# Patient Record
Sex: Male | Born: 1970 | Race: White | Hispanic: No | Marital: Married | Smoking: Never smoker
Health system: Southern US, Community
[De-identification: ages and names within clinical notes are randomized; demographics above are authoritative.]

## PROBLEM LIST (undated history)

## (undated) DIAGNOSIS — E039 Hypothyroidism, unspecified: Secondary | ICD-10-CM

## (undated) DIAGNOSIS — E079 Disorder of thyroid, unspecified: Secondary | ICD-10-CM

## (undated) DIAGNOSIS — K219 Gastro-esophageal reflux disease without esophagitis: Secondary | ICD-10-CM

## (undated) HISTORY — PX: HERNIA REPAIR: SHX51

## (undated) HISTORY — PX: COLONOSCOPY: SHX174

---

## 2020-03-17 ENCOUNTER — Other Ambulatory Visit: Payer: Self-pay

## 2020-03-17 ENCOUNTER — Ambulatory Visit
Admission: EM | Admit: 2020-03-17 | Discharge: 2020-03-17 | Disposition: A | Discharge status: Home / Self Care | Payer: Commercial Managed Care - PPO

## 2020-03-17 DIAGNOSIS — J069 Acute upper respiratory infection, unspecified: Secondary | ICD-10-CM

## 2020-03-17 DIAGNOSIS — Z20822 Contact with and (suspected) exposure to covid-19: Secondary | ICD-10-CM

## 2020-03-17 HISTORY — DX: Disorder of thyroid, unspecified: E07.9

## 2020-03-17 MED ORDER — PREDNISONE 10 MG PO TABS
20.0000 mg | ORAL_TABLET | Freq: Every day | ORAL | 0 refills | Status: DC
Start: 2020-03-17 — End: 2021-11-03

## 2020-03-17 MED ORDER — CETIRIZINE HCL 10 MG PO TABS: 10.0000 mg | ORAL_TABLET | Freq: Every day | ORAL | 0 refills | Status: DC

## 2020-03-17 MED ORDER — BENZONATATE 100 MG PO CAPS
100.0000 mg | ORAL_CAPSULE | Freq: Three times a day (TID) | ORAL | 0 refills | Status: DC
Start: 2020-03-17 — End: 2021-11-03

## 2020-03-17 MED ORDER — FLUTICASONE PROPIONATE 50 MCG/ACT NA SUSP
1.0000 | Freq: Every day | NASAL | 0 refills | Status: DC
Start: 2020-03-17 — End: 2021-11-03

## 2020-03-17 NOTE — ED Provider Notes (Signed)
[facility]   [id number] 03/17/20 Arrival Time: 0817   CC: COVID Exposure  SUBJECTIVE: History from: patient.  Douglas Mcintyre is a 49 y.o. male who presents to the urgent care for complaint of nasal congestion, cough, loss of taste and smell for the past few days.  Wife tested positive for COVID-19.Marland Kitchen  Denies sick exposure to  flu or strep.  Denies recent travel.  Has tried OTC medication without relief.  Denies alleviating factors.  Denies previous symptoms in the past.   Denies fever, chills, fatigue, sinus pain, rhinorrhea, sore throat, SOB, wheezing, chest pain, nausea, changes in bowel or bladder habits.     ROS: As per HPI.  All other pertinent ROS negative.      Past Medical History:  Diagnosis Date  . Thyroid disease    Past Surgical History:  Procedure Laterality Date  . HERNIA REPAIR     No Known Allergies No current facility-administered medications on file prior to encounter.   Current Outpatient Medications on File Prior to Encounter  Medication Sig Dispense Refill  . levothyroxine (SYNTHROID) 88 MCG tablet Take 88 mcg by mouth daily before breakfast.     Social History   Socioeconomic History  . Marital status: Married    Spouse name: Not on file  . Number of children: Not on file  . Years of education: Not on file  . Highest education level: Not on file  Occupational History  . Not on file  Tobacco Use  . Smoking status: Never Smoker  . Smokeless tobacco: Never Used  Substance and Sexual Activity  . Alcohol use: Not Currently  . Drug use: Never  . Sexual activity: Not on file  Other Topics Concern  . Not on file  Social History Narrative  . Not on file   Social Determinants of Health   Financial Resource Strain:   . Difficulty of Paying Living Expenses:   Food Insecurity:   . Worried About Programme researcher, broadcasting/film/video in the Last Year:   . Barista in the Last Year:   Transportation Needs:   . Freight forwarder (Medical):   Marland Kitchen  Lack of Transportation (Non-Medical):   Physical Activity:   . Days of Exercise per Week:   . Minutes of Exercise per Session:   Stress:   . Feeling of Stress :   Social Connections:   . Frequency of Communication with Friends and Family:   . Frequency of Social Gatherings with Friends and Family:   . Attends Religious Services:   . Active Member of Clubs or Organizations:   . Attends Banker Meetings:   Marland Kitchen Marital Status:   Intimate Partner Violence:   . Fear of Current or Ex-Partner:   . Emotionally Abused:   Marland Kitchen Physically Abused:   . Sexually Abused:    History reviewed. No pertinent family history.  OBJECTIVE:  Vitals:   03/17/20 0826  BP: 127/84  Pulse: 82  Resp: 20  Temp: 98.4 F (36.9 C)  SpO2: 95%     General appearance: alert; appears fatigued, but nontoxic; speaking in full sentences and tolerating own secretions HEENT: NCAT; Ears: EACs clear, TMs pearly gray; Eyes: PERRL.  EOM grossly intact. Sinuses: nontender; Nose: nares patent without rhinorrhea, Throat: oropharynx clear, tonsils non erythematous or enlarged, uvula midline  Neck: supple without LAD Lungs: unlabored respirations, symmetrical air entry; cough: mild; no respiratory distress; CTAB Heart: regular rate and rhythm.  Radial pulses 2+ symmetrical bilaterally Skin:  warm and dry Psychological: alert and cooperative; normal mood and affect  LABS:  No results found for this or any previous visit (from the past 24 hour(s)).   ASSESSMENT & PLAN:  1. Close exposure to COVID-19 virus     Meds ordered this encounter  Medications  . cetirizine (ZYRTEC ALLERGY) 10 MG tablet    Sig: Take 1 tablet (10 mg total) by mouth daily.    Dispense:  30 tablet    Refill:  0  . fluticasone (FLONASE) 50 MCG/ACT nasal spray    Sig: Place 1 spray into both nostrils daily for 14 days.    Dispense:  16 g    Refill:  0  . benzonatate (TESSALON) 100 MG capsule    Sig: Take 1 capsule (100 mg total) by  mouth every 8 (eight) hours.    Dispense:  30 capsule    Refill:  0  . predniSONE (DELTASONE) 10 MG tablet    Sig: Take 2 tablets (20 mg total) by mouth daily.    Dispense:  15 tablet    Refill:  0    Discharge Instructions.    COVID testing ordered.  It will take between 2-7 days for test results.  Someone will contact you regarding abnormal results.    In the meantime: You should remain isolated in your home for 10 days from symptom onset AND greater than 24 hours after symptoms resolution (absence of fever without the use of fever-reducing medication and improvement in respiratory symptoms), whichever is longer Get plenty of rest and push fluids Tessalon Perles prescribed for cough Low-dose prednisone prescribed Zyrtec for nasal congestion, runny nose, and/or sore throat Flonase for nasal congestion and runny nose Use medications daily for symptom relief Use OTC medications like ibuprofen or tylenol as needed fever or pain Call or go to the ED if you have any new or worsening symptoms such as fever, worsening cough, shortness of breath, chest tightness, chest pain, turning blue, changes in mental status, etc...   Reviewed expectations re: course of current medical issues. Questions answered. Outlined signs and symptoms indicating need for more acute intervention. Patient verbalized understanding. After Visit Summary given.      Note: This document was prepared using Dragon voice recognition software and may include unintentional dictation errors.    Durward Parcel, FNP 03/17/20 [phone]

## 2020-03-17 NOTE — Discharge Instructions (Addendum)
COVID testing ordered.  It will take between 2-7 days for test results.  Someone will contact you regarding abnormal results.    In the meantime: You should remain isolated in your home for 10 days from symptom onset AND greater than 24 hours after symptoms resolution (absence of fever without the use of fever-reducing medication and improvement in respiratory symptoms), whichever is longer Get plenty of rest and push fluids Tessalon Perles prescribed for cough Low-dose prednisone prescribed Zyrtec for nasal congestion, runny nose, and/or sore throat Flonase for nasal congestion and runny nose Use medications daily for symptom relief Use OTC medications like ibuprofen or tylenol as needed fever or pain Call or go to the ED if you have any new or worsening symptoms such as fever, worsening cough, shortness of breath, chest tightness, chest pain, turning blue, changes in mental status, etc..Marland Kitchen

## 2020-03-17 NOTE — ED Triage Notes (Signed)
Pt presents with c/o nasal congestion and loss of taste and smell , wife is positive

## 2020-03-18 LAB — SARS-COV-2, NAA 2 DAY TAT

## 2020-03-18 LAB — NOVEL CORONAVIRUS, NAA: SARS-CoV-2, NAA: DETECTED — AB

## 2021-10-30 ENCOUNTER — Other Ambulatory Visit: Payer: Self-pay

## 2021-10-30 ENCOUNTER — Encounter (HOSPITAL_COMMUNITY): Payer: Self-pay

## 2021-10-30 ENCOUNTER — Emergency Department (HOSPITAL_COMMUNITY)
Admission: EM | Admit: 2021-10-30 | Discharge: 2021-10-30 | Disposition: A | Discharge status: Home / Self Care | Payer: Commercial Managed Care - PPO | Attending: Emergency Medicine | Admitting: Emergency Medicine

## 2021-10-30 ENCOUNTER — Emergency Department (HOSPITAL_COMMUNITY): Payer: Commercial Managed Care - PPO

## 2021-10-30 DIAGNOSIS — D72829 Elevated white blood cell count, unspecified: Secondary | ICD-10-CM | POA: Insufficient documentation

## 2021-10-30 DIAGNOSIS — Z79899 Other long term (current) drug therapy: Secondary | ICD-10-CM | POA: Diagnosis not present

## 2021-10-30 DIAGNOSIS — R7309 Other abnormal glucose: Secondary | ICD-10-CM | POA: Insufficient documentation

## 2021-10-30 DIAGNOSIS — K420 Umbilical hernia with obstruction, without gangrene: Secondary | ICD-10-CM | POA: Insufficient documentation

## 2021-10-30 DIAGNOSIS — E039 Hypothyroidism, unspecified: Secondary | ICD-10-CM | POA: Insufficient documentation

## 2021-10-30 DIAGNOSIS — R1033 Periumbilical pain: Secondary | ICD-10-CM | POA: Diagnosis present

## 2021-10-30 DIAGNOSIS — R001 Bradycardia, unspecified: Secondary | ICD-10-CM | POA: Insufficient documentation

## 2021-10-30 LAB — COMPREHENSIVE METABOLIC PANEL
ALT: 15 U/L (ref 0–44)
AST: 19 U/L (ref 15–41)
Albumin: 4.4 g/dL (ref 3.5–5.0)
Alkaline Phosphatase: 46 U/L (ref 38–126)
Anion gap: 7 (ref 5–15)
BUN: 16 mg/dL (ref 6–20)
CO2: 26 mmol/L (ref 22–32)
Calcium: 9.3 mg/dL (ref 8.9–10.3)
Chloride: 103 mmol/L (ref 98–111)
Creatinine, Ser: 1.17 mg/dL (ref 0.61–1.24)
GFR, Estimated: >60 mL/min (ref >60)
Glucose, Bld: 164 mg/dL — ABNORMAL HIGH (ref 70–99)
Potassium: 3.5 mmol/L (ref 3.5–5.1)
Sodium: 136 mmol/L (ref 135–145)
Total Bilirubin: 0.6 mg/dL (ref 0.3–1.2)
Total Protein: 7.1 g/dL (ref 6.5–8.1)

## 2021-10-30 LAB — CBC WITH DIFFERENTIAL/PLATELET
Abs Immature Granulocytes: 0.04 10*3/uL (ref 0.00–0.07)
Basophils Absolute: 0.1 10*3/uL (ref 0.0–0.1)
Basophils Relative: 1 %
Eosinophils Absolute: 0.1 10*3/uL (ref 0.0–0.5)
Eosinophils Relative: 1 %
HCT: 39.3 % (ref 39.0–52.0)
Hemoglobin: 13.9 g/dL (ref 13.0–17.0)
Immature Granulocytes: 0 %
Lymphocytes Relative: 19 %
Lymphs Abs: 2 10*3/uL (ref 0.7–4.0)
MCH: 31.1 pg (ref 26.0–34.0)
MCHC: 35.4 g/dL (ref 30.0–36.0)
MCV: 87.9 fL (ref 80.0–100.0)
Monocytes Absolute: 0.5 10*3/uL (ref 0.1–1.0)
Monocytes Relative: 5 %
Neutro Abs: 7.9 10*3/uL — ABNORMAL HIGH (ref 1.7–7.7)
Neutrophils Relative %: 74 %
Platelets: 277 10*3/uL (ref 150–400)
RBC: 4.47 MIL/uL (ref 4.22–5.81)
RDW: 11.3 % — ABNORMAL LOW (ref 11.5–15.5)
WBC: 10.7 10*3/uL — ABNORMAL HIGH (ref 4.0–10.5)
nRBC: 0 % (ref 0.0–0.2)

## 2021-10-30 LAB — LIPASE, BLOOD: Lipase: 44 U/L (ref 11–51)

## 2021-10-30 MED ORDER — IOHEXOL 300 MG/ML  SOLN
100.0000 mL | Freq: Once | INTRAMUSCULAR | Status: AC | PRN
  Administered 2021-10-30: 100 mL via INTRAVENOUS

## 2021-10-30 MED ORDER — ONDANSETRON HCL 4 MG/2ML IJ SOLN
4.0000 mg | Freq: Once | INTRAMUSCULAR | Status: AC
  Administered 2021-10-30: 4 mg via INTRAVENOUS
  Filled 2021-10-30: qty 2

## 2021-10-30 MED ORDER — FENTANYL CITRATE PF 50 MCG/ML IJ SOSY
50.0000 ug | PREFILLED_SYRINGE | Freq: Once | INTRAMUSCULAR | Status: AC
  Administered 2021-10-30: 50 ug via INTRAVENOUS
  Filled 2021-10-30: qty 1

## 2021-10-30 MED ORDER — SODIUM CHLORIDE 0.9 % IV BOLUS
1000.0000 mL | Freq: Once | INTRAVENOUS | Status: AC
  Administered 2021-10-30: 1000 mL via INTRAVENOUS

## 2021-10-30 MED ORDER — HYDROMORPHONE HCL 1 MG/ML IJ SOLN
1.0000 mg | Freq: Once | INTRAMUSCULAR | Status: AC
  Administered 2021-10-30: 1 mg via INTRAVENOUS
  Filled 2021-10-30: qty 1

## 2021-10-30 NOTE — ED Provider Notes (Signed)
?[facility] EMERGENCY DEPARTMENT ?Provider Note ? ? ?CSN: [id number] ?Arrival date & time: 10/30/21  1037 ? ?  ? ?History ? ?Chief Complaint  ?Patient presents with  ? Abdominal Pain  ? ? ?Douglas Mcintyre is a 51 y.o. male. ? ?The history is provided by the patient.  ?Abdominal Pain ? ?Patient is a 51 year old male with history of hypothyroid presents today with umbilical abdominal pain.  Started roughly at 10 AM today while in church, its been constant.  Patient has a known umbilical hernia that is usually easily reducible, he was unable to reduce it while in church.  He is nauseated but has not vomited.  Patient feels lightheaded as if he is going to pass out but denies any chest pain or shortness of breath.  He thinks his last bowel movement was yesterday. ? ?Patient is status post ventral hernia repair, Dr. Orlan Leavens [facility] is his general surgeon.  He also had a colonoscopy performed recently which per him was unremarkable.   ? ?His last meal was at 9 AM this morning. ? ?Patient is not on any blood thinners. ? ?Home Medications ?Prior to Admission medications   ?Medication Sig Start Date End Date Taking? Authorizing Provider  ?benzonatate (TESSALON) 100 MG capsule Take 1 capsule (100 mg total) by mouth every 8 (eight) hours. 03/17/20   Durward Parcel, FNP  ?cetirizine (ZYRTEC ALLERGY) 10 MG tablet Take 1 tablet (10 mg total) by mouth daily. 03/17/20   Durward Parcel, FNP  ?fluticasone (FLONASE) 50 MCG/ACT nasal spray Place 1 spray into both nostrils daily for 14 days. 03/17/20 03/31/20  Durward Parcel, FNP  ?levothyroxine (SYNTHROID) 88 MCG tablet Take 88 mcg by mouth daily before breakfast.    [provider]  ?predniSONE (DELTASONE) 10 MG tablet Take 2 tablets (20 mg total) by mouth daily. 03/17/20   Durward Parcel, FNP  ?   ? ?Allergies    ?Patient has no known allergies.   ? ?Review of Systems   ?Review of Systems  ?Gastrointestinal:  Positive for abdominal pain.   ? ?Physical Exam ?Updated Vital Signs ?BP 130/74   Pulse 60   Temp 98.7 ?F (37.1 ?C) (Oral)   Resp 18   Ht 5\' 7"  (1.702 m)   Wt 83.9 kg   SpO2 100%   BMI 28.98 kg/m?  ?Physical Exam ?Vitals and nursing note reviewed. Exam conducted with a chaperone present.  ?Constitutional:   ?   Appearance: Normal appearance. He is ill-appearing.  ?HENT:  ?   Head: Normocephalic and atraumatic.  ?Eyes:  ?   General: No scleral icterus.    ?   Right eye: No discharge.     ?   Left eye: No discharge.  ?   Extraocular Movements: Extraocular movements intact.  ?   Pupils: Pupils are equal, round, and reactive to light.  ?Cardiovascular:  ?   Rate and Rhythm: Regular rhythm. Bradycardia present.  ?   Pulses: Normal pulses.  ?   Heart sounds: Normal heart sounds. No murmur heard. ?  No friction rub. No gallop.  ?Pulmonary:  ?   Effort: Pulmonary effort is normal. No respiratory distress.  ?   Breath sounds: Normal breath sounds.  ?Abdominal:  ?   General: Abdomen is flat. Bowel sounds are normal.  ?   Palpations: Abdomen is soft.  ?   Tenderness: There is abdominal tenderness in the periumbilical area. There is guarding.  ?   Hernia:  A hernia is present. Hernia is present in the umbilical area.  ?   Comments: Umbilical hernia is hard, not easily reducible.  Extremely tender.  ?Skin: ?   General: Skin is warm and dry.  ?   Coloration: Skin is pale. Skin is not jaundiced.  ?Neurological:  ?   Mental Status: He is alert. Mental status is at baseline.  ?   Coordination: Coordination normal.  ? ? ?ED Results / Procedures / Treatments   ?Labs ?(all labs ordered are listed, but only abnormal results are displayed) ?Labs Reviewed  ?CBC WITH DIFFERENTIAL/PLATELET - Abnormal; Notable for the following components:  ?    Result Value  ? WBC 10.7 (*)   ? RDW 11.3 (*)   ? Neutro Abs 7.9 (*)   ? All other components within normal limits  ?COMPREHENSIVE METABOLIC PANEL - Abnormal; Notable for the following components:  ? Glucose, Bld 164 (*)    ? All other components within normal limits  ?LIPASE, BLOOD  ? ? ?EKG ?EKG Interpretation ? ?Date/Time:  Sunday October 30 2021 11:18:55 EDT ?Ventricular Rate:  41 ?PR Interval:  124 ?QRS Duration: 103 ?QT Interval:  507 ?QTC Calculation: 419 ?R Axis:   80 ?Text Interpretation: Sinus bradycardia Abnormal ECG Confirmed by Gerhard Munch [phone]) on 10/30/2021 11:50:01 AM ? ?Radiology ?CT Abdomen Pelvis W Contrast ? ?Result Date: 10/30/2021 ?CLINICAL DATA:  51 year old male with abdominal pain at known umbilical hernia. EXAM: CT ABDOMEN AND PELVIS WITH CONTRAST TECHNIQUE: Multidetector CT imaging of the abdomen and pelvis was performed using the standard protocol following bolus administration of intravenous contrast. RADIATION DOSE REDUCTION: This exam was performed according to the departmental dose-optimization program which includes automated exposure control, adjustment of the mA and/or kV according to patient size and/or use of iterative reconstruction technique. CONTRAST:  OMNIPAQUE IOHEXOL 300 MG/ML  SOLN COMPARISON:  None. FINDINGS: Lower chest: No acute abnormality. Hepatobiliary: The liver and gallbladder are unremarkable except for small hepatic cysts. No biliary dilatation. Pancreas: Unremarkable Spleen: Unremarkable Adrenals/Urinary Tract: The kidneys, adrenal glands and bladder are unremarkable. Stomach/Bowel: A small to moderate umbilical hernia is identified containing a loop of small bowel, with mildly distended loops small bowel loops proximal to this hernia - compatible with early/developing small bowel obstruction. There is no evidence of pneumoperitoneum or abscess. Colonic diverticulosis is identified without evidence of acute diverticulitis. The appendix is normal. Vascular/Lymphatic: No significant vascular findings are present. No enlarged abdominal or pelvic lymph nodes. Reproductive: Prostate appears unremarkable. The seminal vesicles are prominent without adjacent inflammation or defined  mass. Other: No ascites, focal collection or pneumoperitoneum. Musculoskeletal: No acute or suspicious bony abnormalities are noted. IMPRESSION: 1. Small to moderate umbilical hernia containing a loop of small bowel and causing early/developing small bowel obstruction. No evidence of pneumoperitoneum or abscess. Electronically Signed   By: Harmon Pier M.D.   On: 10/30/2021 12:24   ? ?Procedures ?Procedures  ? ? ?Medications Ordered in ED ?Medications  ?fentaNYL (SUBLIMAZE) injection 50 mcg (50 mcg Intravenous Given 10/30/21 1110)  ?sodium chloride 0.9 % bolus 1,000 mL (0 mLs Intravenous Stopped 10/30/21 1224)  ?ondansetron [facility]) injection 4 mg (4 mg Intravenous Given 10/30/21 1110)  ?iohexol (OMNIPAQUE) 300 MG/ML solution 100 mL (100 mLs Intravenous Contrast Given 10/30/21 1144)  ?HYDROmorphone (DILAUDID) injection 1 mg (1 mg Intravenous Given 10/30/21 1240)  ?ondansetron (ZOFRAN) injection 4 mg (4 mg Intravenous Given 10/30/21 1240)  ? ? ?ED Course/ Medical Decision Making/ A&P ?Clinical Course as of 10/30/21  1555  ?Sun Oct 30, 2021  ?1238 Patient is now actively vomiting in the room. [HS]  ?  ?Clinical Course User Index ?[HS] Theron AristaSage, Deren Degrazia, PA-C  ? ?                        ?Medical Decision Making ?Amount and/or Complexity of Data Reviewed ?Labs: ordered. ?Radiology: ordered. ? ?Risk ?Prescription drug management. ? ? ?This patient presents to the ED for concern of pain alongside of umbilical hernia, this involves an extensive number of treatment options, and is a complaint that carries with it a high risk of complications and morbidity.  The differential diagnosis includes but not limited to: incarcerated hernia, strangulated hernia, small bowel obstruction, mesenteric ischemia. ? ? ?Additional history obtained:  ? ?Independent historian: wife ? ?Records reviewed, status post 1 ventral hernia repair but no previous abdominal surgeries. ?  ?Lab Tests: ? ?I ordered, viewed, and personally interpreted labs.  The pertinent  results include: Mild leukocytosis at 10.7.  No anemia.  No gross electrolyte derangement or AKI, slight elevation of glucose at 164.  Lipase within normal limits. ? ?  ?Imaging Studies ordered: ? ?I directly visualized th

## 2021-10-30 NOTE — Discharge Instructions (Signed)
You had and incarcerated hernia, this was reduced here in the emergency department.  Do not need to stay in the ED any longer, please return if this happens again.  As discussed with Dr. Lovell Sheehan please schedule appointment with general surgery for hernia repair in the future.  Return to the ED if it becomes stuck again. ?

## 2021-10-30 NOTE — Consult Note (Signed)
Reason for Consult: Incarcerated umbilical hernia ?Referring Physician: Dr. Jeraldine Loots ? ?Douglas Mcintyre is an 51 y.o. male.  ?HPI: Patient is a 51 year old white male with a known umbilical hernia for approximately the last 3 years who presented emergency room with worsening swelling and pain along with nausea at the umbilicus.  He did have a significant hard swelling in the umbilicus.  He states this started earlier today.  He was not able to reduce it and thus came to the emergency room.  He states he has had a similar episode late last year.  He does have some nausea but no vomiting. ? ?Past Medical History:  ?Diagnosis Date  ? Thyroid disease   ? ? ?Past Surgical History:  ?Procedure Laterality Date  ? HERNIA REPAIR    ? ? ?History reviewed. No pertinent family history. ? ?Social History:  reports that he has never smoked. He has never used smokeless tobacco. He reports that he does not currently use alcohol. He reports that he does not use drugs. ? ?Allergies: No Known Allergies ? ?Medications: I have reviewed the patient's current medications. ? ?Results for orders placed or performed during the hospital encounter of 10/30/21 (from the past 48 hour(s))  ?CBC with Differential     Status: Abnormal  ? Collection Time: 10/30/21 10:50 AM  ?Result Value Ref Range  ? WBC 10.7 (H) 4.0 - 10.5 K/uL  ? RBC 4.47 4.22 - 5.81 MIL/uL  ? Hemoglobin 13.9 13.0 - 17.0 g/dL  ? HCT 39.3 39.0 - 52.0 %  ? MCV 87.9 80.0 - 100.0 fL  ? MCH 31.1 26.0 - 34.0 pg  ? MCHC 35.4 30.0 - 36.0 g/dL  ? RDW 11.3 (L) 11.5 - 15.5 %  ? Platelets 277 150 - 400 K/uL  ? nRBC 0.0 0.0 - 0.2 %  ? Neutrophils Relative % 74 %  ? Neutro Abs 7.9 (H) 1.7 - 7.7 K/uL  ? Lymphocytes Relative 19 %  ? Lymphs Abs 2.0 0.7 - 4.0 K/uL  ? Monocytes Relative 5 %  ? Monocytes Absolute 0.5 0.1 - 1.0 K/uL  ? Eosinophils Relative 1 %  ? Eosinophils Absolute 0.1 0.0 - 0.5 K/uL  ? Basophils Relative 1 %  ? Basophils Absolute 0.1 0.0 - 0.1 K/uL  ? Immature Granulocytes 0 %  ?  Abs Immature Granulocytes 0.04 0.00 - 0.07 K/uL  ?  Comment: Performed at [facility], [address], [place], [place] [address]  ?Comprehensive metabolic panel     Status: Abnormal  ? Collection Time: 10/30/21 10:50 AM  ?Result Value Ref Range  ? Sodium 136 135 - 145 mmol/L  ? Potassium 3.5 3.5 - 5.1 mmol/L  ? Chloride 103 98 - 111 mmol/L  ? CO2 26 22 - 32 mmol/L  ? Glucose, Bld 164 (H) 70 - 99 mg/dL  ?  Comment: Glucose reference range applies only to samples taken after fasting for at least 8 hours.  ? BUN 16 6 - 20 mg/dL  ? Creatinine, Ser 1.17 0.61 - 1.24 mg/dL  ? Calcium 9.3 8.9 - 10.3 mg/dL  ? Total Protein 7.1 6.5 - 8.1 g/dL  ? Albumin 4.4 3.5 - 5.0 g/dL  ? AST 19 15 - 41 U/L  ? ALT 15 0 - 44 U/L  ? Alkaline Phosphatase 46 38 - 126 U/L  ? Total Bilirubin 0.6 0.3 - 1.2 mg/dL  ? GFR, Estimated >60 >60 mL/min  ?  Comment: (NOTE) ?Calculated using the CKD-EPI Creatinine Equation (2021) ?  ?  Anion gap 7 5 - 15  ?  Comment: Performed at [facility], [address] [place], [place] [address]  ?Lipase, blood     Status: None  ? Collection Time: 10/30/21 10:50 AM  ?Result Value Ref Range  ? Lipase 44 11 - 51 U/L  ?  Comment: Performed at [facility], [address], [place], [place] [address]  ? ? ?CT Abdomen Pelvis W Contrast ? ?Result Date: 10/30/2021 ?CLINICAL DATA:  51 year old male with abdominal pain at known umbilical hernia. EXAM: CT ABDOMEN AND PELVIS WITH CONTRAST TECHNIQUE: Multidetector CT imaging of the abdomen and pelvis was performed using the standard protocol following bolus administration of intravenous contrast. RADIATION DOSE REDUCTION: This exam was performed according to the departmental dose-optimization program which includes automated exposure control, adjustment of the mA and/or kV according to patient size and/or use of iterative reconstruction technique. CONTRAST:  OMNIPAQUE IOHEXOL 300 MG/ML  SOLN COMPARISON:  None. FINDINGS: Lower chest: No acute abnormality. Hepatobiliary: The  liver and gallbladder are unremarkable except for small hepatic cysts. No biliary dilatation. Pancreas: Unremarkable Spleen: Unremarkable Adrenals/Urinary Tract: The kidneys, adrenal glands and bladder are unremarkable. Stomach/Bowel: A small to moderate umbilical hernia is identified containing a loop of small bowel, with mildly distended loops small bowel loops proximal to this hernia - compatible with early/developing small bowel obstruction. There is no evidence of pneumoperitoneum or abscess. Colonic diverticulosis is identified without evidence of acute diverticulitis. The appendix is normal. Vascular/Lymphatic: No significant vascular findings are present. No enlarged abdominal or pelvic lymph nodes. Reproductive: Prostate appears unremarkable. The seminal vesicles are prominent without adjacent inflammation or defined mass. Other: No ascites, focal collection or pneumoperitoneum. Musculoskeletal: No acute or suspicious bony abnormalities are noted. IMPRESSION: 1. Small to moderate umbilical hernia containing a loop of small bowel and causing early/developing small bowel obstruction. No evidence of pneumoperitoneum or abscess. Electronically Signed   By: Harmon Pier M.D.   On: 10/30/2021 12:24   ? ?ROS:  ?Pertinent items are noted in HPI. ? ?Blood pressure 109/69, pulse (!) 54, temperature 98.7 ?F (37.1 ?C), temperature source Oral, resp. rate 16, height 5\' 7"  (1.702 m), weight 83.9 kg, SpO2 96 %. ?Physical Exam: Well-developed well-nourished white male no acute distress ?Head is normocephalic, atraumatic ?Abdomen is soft with a hard 3 cm subcutaneous mass at the umbilicus.  After some time, the hernia was reduced. ? ?CT scan images personally reviewed ? ?Assessment/Plan: ?Impression: Incarcerated umbilical hernia, now reduced ?Plan: Patient was given instructions on how to reduce the hernia on his own.  He was instructed to follow-up with my office to schedule umbilical hernia repair.  Patient may be  discharged home. ? ? ?10/30/2021, 2:34 PM  ? ? ?  ?

## 2021-10-30 NOTE — ED Triage Notes (Signed)
Patient reports umbilical hernia that he states he normally pushes back in but has been unable to do so since yesterday. States that he is pending sx for problem. Endorses nausea when pressing on abdomen.  ?

## 2021-11-03 ENCOUNTER — Encounter: Payer: Self-pay | Admitting: General Surgery

## 2021-11-03 ENCOUNTER — Other Ambulatory Visit: Payer: Self-pay

## 2021-11-03 ENCOUNTER — Ambulatory Visit (INDEPENDENT_AMBULATORY_CARE_PROVIDER_SITE_OTHER): Payer: Commercial Managed Care - PPO | Admitting: General Surgery

## 2021-11-03 VITALS — BP 130/81 | HR 62 | Temp 97.6°F | Resp 12 | Ht 67.0 in | Wt 181.0 lb

## 2021-11-03 DIAGNOSIS — K429 Umbilical hernia without obstruction or gangrene: Secondary | ICD-10-CM | POA: Diagnosis not present

## 2021-11-03 NOTE — Progress Notes (Signed)
Subjective:  ?  ? Douglas Mcintyre  ?Patient is here for ER follow-up, status post reduction of an incarcerated umbilical hernia.  He states he is still sore but is able to reduce the hernia on its own.  He would like to proceed with scheduling surgery. ?Objective:  ? ? BP 130/81   Pulse 62   Temp 97.6 ?F (36.4 ?C) (Oral)   Resp 12   Ht 5\' 7"  (1.702 m)   Wt 181 lb (82.1 kg)   SpO2 96%   BMI 28.35 kg/m?  ? ?General:  alert, cooperative, and no distress  ?Abdomen soft, umbilical hernia is present but reducible. ?   ? ?Assessment:  ? ? Umbilical hernia, less than 3 cm in size.  ?  ?Plan:  ? ?Patient is scheduled for an umbilical herniorrhaphy with mesh on 11/14/2021.  The risks and benefits of the procedure including bleeding, infection, mesh use, and the possibility of recurrence of the hernia were fully explained to the patient, who gave informed consent. ?

## 2021-11-07 NOTE — H&P (Signed)
Steven Basso is an 51 y.o. male.   ?Chief Complaint: Umbilical hernia ?HPI: Patient is a 51 year old white male who was recently seen in the emergency room for incarceration of his umbilical hernia.  It was reduced in the emergency room.  He now presents for elective repair of the hernia.  Is been present for many years but has recently increased in size and is causing him discomfort.  No nausea or vomiting are noted. ? ?Past Medical History:  ?Diagnosis Date  ? Thyroid disease   ? ? ?Past Surgical History:  ?Procedure Laterality Date  ? HERNIA REPAIR    ? ? ?No family history on file. ?Social History:  reports that he has never smoked. He has never used smokeless tobacco. He reports that he does not currently use alcohol. He reports that he does not use drugs. ? ?Allergies: No Known Allergies ? ?No medications prior to admission.  ? ? ?No results found for this or any previous visit (from the past 48 hour(s)). ?No results found. ? ?Review of Systems  ?Constitutional: Negative.   ?HENT: Negative.    ?Eyes: Negative.   ?Respiratory: Negative.    ?Cardiovascular: Negative.   ?Gastrointestinal:  Positive for abdominal pain.  ?Endocrine: Negative.   ?Genitourinary: Negative.   ?Musculoskeletal: Negative.   ?Skin: Negative.   ?Allergic/Immunologic: Negative.   ?Neurological: Negative.   ?Hematological: Negative.   ?Psychiatric/Behavioral: Negative.    ? ?There were no vitals taken for this visit. ?Physical Exam ?Vitals reviewed.  ?Constitutional:   ?   Appearance: Normal appearance. He is normal weight. He is not ill-appearing.  ?HENT:  ?   Head: Normocephalic and atraumatic.  ?Cardiovascular:  ?   Rate and Rhythm: Normal rate and regular rhythm.  ?   Heart sounds: Normal heart sounds. No murmur heard. ?  No friction rub. No gallop.  ?Pulmonary:  ?   Effort: Pulmonary effort is normal. No respiratory distress.  ?   Breath sounds: Normal breath sounds. No stridor. No wheezing, rhonchi or rales.  ?Abdominal:  ?    General: Bowel sounds are normal. There is no distension.  ?   Palpations: Abdomen is soft. There is no mass.  ?   Tenderness: There is no abdominal tenderness. There is no guarding or rebound.  ?   Hernia: A hernia is present.  ?   Comments: Reducible umbilical hernia, up to 3 cm in size.  ?Skin: ?   General: Skin is warm and dry.  ?Neurological:  ?   Mental Status: He is alert and oriented to person, place, and time.  ?  ? ?Assessment/Plan ?Impression: Umbilical hernia ?Plan: Patient is scheduled for an umbilical herniorrhaphy with mesh on 11/14/2021.  The risks and benefits of the procedure including bleeding, infection, mesh use, and the possibility of recurrence of the hernia were fully explained to the patient, who gave informed consent. ? ?Franky Macho, MD ?11/07/2021, 7:26 AM ? ? ? ?

## 2021-11-10 ENCOUNTER — Encounter (HOSPITAL_COMMUNITY): Payer: Self-pay

## 2021-11-10 ENCOUNTER — Other Ambulatory Visit: Payer: Self-pay

## 2021-11-10 ENCOUNTER — Encounter (HOSPITAL_COMMUNITY)
Admission: RE | Admit: 2021-11-10 | Discharge: 2021-11-10 | Disposition: A | Discharge status: Home / Self Care | Payer: Commercial Managed Care - PPO | Source: Ambulatory Visit | Attending: General Surgery | Admitting: General Surgery

## 2021-11-10 HISTORY — DX: Hypothyroidism, unspecified: E03.9

## 2021-11-10 HISTORY — DX: Gastro-esophageal reflux disease without esophagitis: K21.9

## 2021-11-10 NOTE — Pre-Procedure Instructions (Signed)
Attempted pre-op phone call. No voicemail to leave message. ?

## 2021-11-14 ENCOUNTER — Ambulatory Visit (HOSPITAL_COMMUNITY)
Admission: RE | Admit: 2021-11-14 | Discharge: 2021-11-14 | Disposition: A | Discharge status: Home / Self Care | Payer: Commercial Managed Care - PPO | Attending: General Surgery | Admitting: General Surgery

## 2021-11-14 ENCOUNTER — Encounter (HOSPITAL_COMMUNITY): Payer: Self-pay | Admitting: General Surgery

## 2021-11-14 ENCOUNTER — Ambulatory Visit (HOSPITAL_COMMUNITY): Payer: Commercial Managed Care - PPO | Admitting: Anesthesiology

## 2021-11-14 ENCOUNTER — Other Ambulatory Visit: Payer: Self-pay

## 2021-11-14 ENCOUNTER — Encounter: Payer: Self-pay | Admitting: *Deleted

## 2021-11-14 ENCOUNTER — Encounter (HOSPITAL_COMMUNITY)
Admission: RE | Disposition: A | Discharge status: Home / Self Care | Payer: Self-pay | Source: Home / Self Care | Attending: General Surgery

## 2021-11-14 ENCOUNTER — Ambulatory Visit (HOSPITAL_BASED_OUTPATIENT_CLINIC_OR_DEPARTMENT_OTHER): Payer: Commercial Managed Care - PPO | Admitting: Anesthesiology

## 2021-11-14 DIAGNOSIS — K429 Umbilical hernia without obstruction or gangrene: Secondary | ICD-10-CM | POA: Diagnosis present

## 2021-11-14 HISTORY — PX: UMBILICAL HERNIA REPAIR: SHX196

## 2021-11-14 SURGERY — REPAIR, HERNIA, UMBILICAL, ADULT
Anesthesia: General | Site: Abdomen

## 2021-11-14 MED ORDER — MIDAZOLAM HCL 2 MG/2ML IJ SOLN
INTRAMUSCULAR | Status: AC
  Filled 2021-11-14: qty 2

## 2021-11-14 MED ORDER — ORAL CARE MOUTH RINSE: 15.0000 mL | Freq: Once | OROMUCOSAL | Status: AC

## 2021-11-14 MED ORDER — CEFAZOLIN SODIUM-DEXTROSE 2-4 GM/100ML-% IV SOLN
2.0000 g | INTRAVENOUS | Status: AC
  Administered 2021-11-14: 2 g via INTRAVENOUS
  Filled 2021-11-14: qty 100

## 2021-11-14 MED ORDER — ONDANSETRON HCL 4 MG/2ML IJ SOLN
INTRAMUSCULAR | Status: DC | PRN
Start: 2021-11-14 — End: 2021-11-14
  Administered 2021-11-14: 4 mg via INTRAVENOUS

## 2021-11-14 MED ORDER — KETOROLAC TROMETHAMINE 30 MG/ML IJ SOLN
30.0000 mg | Freq: Once | INTRAMUSCULAR | Status: AC
  Administered 2021-11-14: 30 mg via INTRAVENOUS
  Filled 2021-11-14: qty 1

## 2021-11-14 MED ORDER — CHLORHEXIDINE GLUCONATE CLOTH 2 % EX PADS: 6.0000 | MEDICATED_PAD | Freq: Once | CUTANEOUS | Status: DC

## 2021-11-14 MED ORDER — CHLORHEXIDINE GLUCONATE 0.12 % MT SOLN
15.0000 mL | Freq: Once | OROMUCOSAL | Status: AC
  Administered 2021-11-14: 15 mL via OROMUCOSAL

## 2021-11-14 MED ORDER — FENTANYL CITRATE (PF) 100 MCG/2ML IJ SOLN
INTRAMUSCULAR | Status: DC | PRN
Start: 2021-11-14 — End: 2021-11-14
  Administered 2021-11-14 (×2): 25 ug via INTRAVENOUS
  Administered 2021-11-14: 50 ug via INTRAVENOUS

## 2021-11-14 MED ORDER — FENTANYL CITRATE PF 50 MCG/ML IJ SOSY
25.0000 ug | PREFILLED_SYRINGE | INTRAMUSCULAR | Status: DC | PRN
  Administered 2021-11-14: 50 ug via INTRAVENOUS

## 2021-11-14 MED ORDER — MIDAZOLAM HCL 2 MG/2ML IJ SOLN
INTRAMUSCULAR | Status: DC | PRN
  Administered 2021-11-14 (×2): 1 mg via INTRAVENOUS

## 2021-11-14 MED ORDER — LACTATED RINGERS IV SOLN: INTRAVENOUS | Status: DC

## 2021-11-14 MED ORDER — ONDANSETRON HCL 4 MG/2ML IJ SOLN: 4.0000 mg | Freq: Once | INTRAMUSCULAR | Status: DC | PRN

## 2021-11-14 MED ORDER — ONDANSETRON HCL 4 MG/2ML IJ SOLN
INTRAMUSCULAR | Status: AC
  Filled 2021-11-14: qty 2

## 2021-11-14 MED ORDER — HYDROCODONE-ACETAMINOPHEN 10-325 MG PO TABS: 1.0000 | ORAL_TABLET | Freq: Four times a day (QID) | ORAL | 0 refills | Status: DC | PRN

## 2021-11-14 MED ORDER — SODIUM CHLORIDE 0.9 % IR SOLN
Status: DC | PRN
  Administered 2021-11-14: 1000 mL

## 2021-11-14 MED ORDER — GLYCOPYRROLATE PF 0.2 MG/ML IJ SOSY
PREFILLED_SYRINGE | INTRAMUSCULAR | Status: AC
  Filled 2021-11-14: qty 2

## 2021-11-14 MED ORDER — PROPOFOL 10 MG/ML IV BOLUS
INTRAVENOUS | Status: AC
  Filled 2021-11-14: qty 20

## 2021-11-14 MED ORDER — LIDOCAINE HCL (CARDIAC) PF 50 MG/5ML IV SOSY
PREFILLED_SYRINGE | INTRAVENOUS | Status: DC | PRN
  Administered 2021-11-14: 80 mg via INTRAVENOUS

## 2021-11-14 MED ORDER — FENTANYL CITRATE (PF) 100 MCG/2ML IJ SOLN
INTRAMUSCULAR | Status: AC
  Filled 2021-11-14: qty 2

## 2021-11-14 MED ORDER — BUPIVACAINE LIPOSOME 1.3 % IJ SUSP
INTRAMUSCULAR | Status: AC
  Filled 2021-11-14: qty 20

## 2021-11-14 MED ORDER — LIDOCAINE HCL (PF) 2 % IJ SOLN
INTRAMUSCULAR | Status: AC
  Filled 2021-11-14: qty 5

## 2021-11-14 MED ORDER — BUPIVACAINE LIPOSOME 1.3 % IJ SUSP
INTRAMUSCULAR | Status: DC | PRN
  Administered 2021-11-14: 20 mL

## 2021-11-14 MED ORDER — GLYCOPYRROLATE 0.2 MG/ML IJ SOLN
INTRAMUSCULAR | Status: DC | PRN
  Administered 2021-11-14: .2 mg via INTRAVENOUS

## 2021-11-14 MED ORDER — FENTANYL CITRATE PF 50 MCG/ML IJ SOSY
PREFILLED_SYRINGE | INTRAMUSCULAR | Status: AC
  Filled 2021-11-14: qty 1

## 2021-11-14 MED ORDER — PROPOFOL 10 MG/ML IV BOLUS
INTRAVENOUS | Status: DC | PRN
  Administered 2021-11-14: 150 mg via INTRAVENOUS

## 2021-11-14 SURGICAL SUPPLY — 34 items
ADH SKN CLS APL DERMABOND .7 (GAUZE/BANDAGES/DRESSINGS) ×1
APL PRP STRL LF DISP 70% ISPRP (MISCELLANEOUS) ×1
BLADE SURG SZ11 CARB STEEL (BLADE) ×2 IMPLANT
CHLORAPREP W/TINT 26 (MISCELLANEOUS) ×2 IMPLANT
CLOTH BEACON ORANGE TIMEOUT ST (SAFETY) ×2 IMPLANT
COVER LIGHT HANDLE STERIS (MISCELLANEOUS) ×4 IMPLANT
DERMABOND ADVANCED (GAUZE/BANDAGES/DRESSINGS) ×1
DERMABOND ADVANCED .7 DNX12 (GAUZE/BANDAGES/DRESSINGS) ×1 IMPLANT
ELECT REM PT RETURN 9FT ADLT (ELECTROSURGICAL) ×2
ELECTRODE REM PT RTRN 9FT ADLT (ELECTROSURGICAL) ×1 IMPLANT
GAUZE 4X4 16PLY ~~LOC~~+RFID DBL (SPONGE) ×2 IMPLANT
GLOVE BIO SURGEON STRL SZ7.5 (GLOVE) ×1 IMPLANT
GLOVE BIOGEL PI IND STRL 7.5 (GLOVE) IMPLANT
GLOVE BIOGEL PI INDICATOR 7.5 (GLOVE) ×1
GLOVE ECLIPSE 6.5 STRL STRAW (GLOVE) ×1 IMPLANT
GOWN STRL REUS W/TWL LRG LVL3 (GOWN DISPOSABLE) ×4 IMPLANT
INST SET MINOR GENERAL (KITS) ×2 IMPLANT
KIT TURNOVER KIT A (KITS) ×2 IMPLANT
MANIFOLD NEPTUNE II (INSTRUMENTS) ×2 IMPLANT
MESH VENTRALEX ST 1-7/10 CRC S (Mesh General) ×1 IMPLANT
NDL HYPO 18GX1.5 BLUNT FILL (NEEDLE) ×1 IMPLANT
NDL HYPO 21X1.5 SAFETY (NEEDLE) ×1 IMPLANT
NEEDLE HYPO 18GX1.5 BLUNT FILL (NEEDLE) ×2 IMPLANT
NEEDLE HYPO 21X1.5 SAFETY (NEEDLE) ×2 IMPLANT
NS IRRIG 1000ML POUR BTL (IV SOLUTION) ×2 IMPLANT
PACK MINOR (CUSTOM PROCEDURE TRAY) ×2 IMPLANT
PAD ARMBOARD 7.5X6 YLW CONV (MISCELLANEOUS) ×2 IMPLANT
SET BASIN LINEN APH (SET/KITS/TRAYS/PACK) ×2 IMPLANT
SUT ETHIBOND NAB MO 7 #0 18IN (SUTURE) ×2 IMPLANT
SUT MNCRL AB 4-0 PS2 18 (SUTURE) ×2 IMPLANT
SUT VIC AB 2-0 CT2 27 (SUTURE) ×2 IMPLANT
SUT VIC AB 3-0 SH 27 (SUTURE) ×2
SUT VIC AB 3-0 SH 27X BRD (SUTURE) ×1 IMPLANT
SYR 20ML LL LF (SYRINGE) ×3 IMPLANT

## 2021-11-14 NOTE — Interval H&P Note (Signed)
History and Physical Interval Note: ? ?11/14/2021 ?8:30 AM ? ?[name]  has presented today for surgery, with the diagnosis of Umbilical hernia without obstruction and without gangrene.  The various methods of treatment have been discussed with the patient and family. After consideration of risks, benefits and other options for treatment, the patient has consented to  Procedure(s) with comments: ?HERNIA REPAIR UMBILICAL ADULT (N/A) - pt knows to arrive at 7:30 as a surgical intervention.  The patient's history has been reviewed, patient examined, no change in status, stable for surgery.  I have reviewed the patient's chart and labs.  Questions were answered to the patient's satisfaction.   ? ? ?Douglas Mcintyre ? ? ?

## 2021-11-14 NOTE — Addendum Note (Signed)
Addendum  created 11/14/21 1028 by Windell Norfolk, MD  ? Order list changed, Pharmacy for encounter modified  ?  ?

## 2021-11-14 NOTE — Anesthesia Preprocedure Evaluation (Addendum)
Anesthesia Evaluation  ?Patient identified by MRN, date of birth, ID band ?Patient awake ? ? ? ?Reviewed: ?Allergy & Precautions, H&P , NPO status , Patient's Chart, lab work & pertinent test results, reviewed documented beta blocker date and time  ? ?Airway ?Mallampati: II ? ?TM Distance: >3 FB ?Neck ROM: full ? ? ? Dental ?no notable dental hx. ? ?  ?Pulmonary ?neg pulmonary ROS,  ?  ?Pulmonary exam normal ?breath sounds clear to auscultation ? ? ? ? ? ? Cardiovascular ?Exercise Tolerance: Good ?negative cardio ROS ? ? ?Rhythm:regular Rate:Normal ? ? ?  ?Neuro/Psych ?negative neurological ROS ? negative psych ROS  ? GI/Hepatic ?Neg liver ROS, GERD  Medicated,  ?Endo/Other  ?Hypothyroidism  ? Renal/GU ?negative Renal ROS  ?negative genitourinary ?  ?Musculoskeletal ? ? Abdominal ?  ?Peds ? Hematology ?negative hematology ROS ?(+)   ?Anesthesia Other Findings ? ? Reproductive/Obstetrics ?negative OB ROS ? ?  ? ? ? ? ? ? ? ? ? ? ? ? ? ?  ?  ? ? ? ? ? ? ? ? ?Anesthesia Physical ?Anesthesia Plan ? ?ASA: 2 ? ?Anesthesia Plan: General and General ETT  ? ?Post-op Pain Management:   ? ?Induction:  ? ?PONV Risk Score and Plan: Ondansetron ? ?Airway Management Planned:  ? ?Additional Equipment:  ? ?Intra-op Plan:  ? ?Post-operative Plan:  ? ?Informed Consent: I have reviewed the patients History and Physical, chart, labs and discussed the procedure including the risks, benefits and alternatives for the proposed anesthesia with the patient or authorized representative who has indicated his/her understanding and acceptance.  ? ? ? ?Dental Advisory Given ? ?Plan Discussed with: CRNA ? ?Anesthesia Plan Comments:   ? ? ? ? ? ?Anesthesia Quick Evaluation ? ?

## 2021-11-14 NOTE — Op Note (Signed)
Patient:  Douglas Mcintyre ? ?DOB:  1970/12/27 ? ?MRN:  [id number] ? ? ?Preop Diagnosis: Umbilical hernia ? ?Postop Diagnosis: Same ? ?Procedure: Umbilical herniorrhaphy with mesh ? ?Surgeon: Franky Macho, MD ? ?Anes: General ? ?Indications: Patient is a 51 year old white male who presents with a symptomatic umbilical hernia.  He did have it reduced in the emergency room several weeks ago.  He now presents for formal repair.  The risks and benefits of the procedure including bleeding, infection, mesh use, and the possibility of recurrence of the hernia were fully explained to the patient, who gave informed consent. ? ?Procedure note: The patient was placed in the supine position.  After general anesthesia was administered, the abdomen was prepped and draped using the usual sterile technique with ChloraPrep.  Surgical site confirmation was performed. ? ?An infraumbilical incision was made down to the fascia.  The umbilicus was freed away from the hernia sac and fascia.  The patient was noted to have omentum within the hernia sac.  The hernia sac was excised down to the the fascia.  The omentum then was reduced into the abdominal cavity without difficulty.  I did palpate the surrounding anterior abdominal wall and no other defects were noted.  The resultant defect was approximately 1.5 cm in its greatest diameter.  A 4.3 cm Bard Ventralax ST patch was then inserted and secured to the fascia using 0 Ethibond interrupted sutures.  The overlying fascia was reapproximated transversely using 0 Ethibond interrupted sutures.  The base of the umbilicus was secured back to the fascia using a 2-0 Vicryl interrupted suture.  The subcutaneous layer was reapproximated using a 3-0 Vicryl interrupted suture.  Exparel was instilled into the surrounding wound.  The skin was closed using a 4-0 Monocryl subcuticular suture.  Dermabond was applied. ? ?All tape and needle counts were correct at the end of the procedure.  The patient was  awakened and transferred to PACU in stable condition. ? ?Complications: None ? ?EBL: Minimal ? ?Specimen: None ? ? ?  ?

## 2021-11-14 NOTE — Anesthesia Procedure Notes (Signed)
Procedure Name: LMA Insertion ?Date/Time: 11/14/2021 9:08 AM ?Performed by: Franco Nones, CRNA ?Pre-anesthesia Checklist: Patient identified, Patient being monitored, Emergency Drugs available, Timeout performed and Suction available ?Patient Re-evaluated:Patient Re-evaluated prior to induction ?Oxygen Delivery Method: Circle System Utilized ?Preoxygenation: Pre-oxygenation with 100% oxygen ?Induction Type: IV induction ?Ventilation: Mask ventilation without difficulty ?LMA: LMA inserted ?LMA Size: 4.0 ?Number of attempts: 1 ?Placement Confirmation: positive ETCO2 and breath sounds checked- equal and bilateral ?Tube secured with: Tape ?Dental Injury: Teeth and Oropharynx as per pre-operative assessment  ? ? ? ? ?

## 2021-11-14 NOTE — Anesthesia Postprocedure Evaluation (Signed)
Anesthesia Post Note ? ?Patient: Douglas Mcintyre ? ?Procedure(s) Performed: HERNIA REPAIR UMBILICAL ADULT (Abdomen) ? ?Patient location during evaluation: Phase II ?Anesthesia Type: General ?Level of consciousness: awake ?Pain management: pain level controlled ?Vital Signs Assessment: post-procedure vital signs reviewed and stable ?Respiratory status: spontaneous breathing and respiratory function stable ?Cardiovascular status: blood pressure returned to baseline and stable ?Postop Assessment: no headache and no apparent nausea or vomiting ?Anesthetic complications: no ?Comments: Late entry ? ? ?No notable events documented. ? ? ?Last Vitals:  ?Vitals:  ? 11/14/21 0756 11/14/21 1005  ?BP: 127/87 (!) 143/97  ?Pulse: (!) 53 63  ?Resp: 14 16  ?Temp: (!) 36.4 ?C   ?SpO2: 100% 100%  ?  ?Last Pain:  ?Vitals:  ? 11/14/21 0756  ?TempSrc: Oral  ?PainSc: 0-No pain  ? ? ?  ?  ?  ?  ?  ?  ? ?Windell Norfolk ? ? ? ? ?

## 2021-11-14 NOTE — Transfer of Care (Signed)
Immediate Anesthesia Transfer of Care Note ? ?Patient: Douglas Mcintyre ? ?Procedure(s) Performed: HERNIA REPAIR UMBILICAL ADULT (Abdomen) ? ?Patient Location: PACU ? ?Anesthesia Type:General ? ?Level of Consciousness: awake and patient cooperative ? ?Airway & Oxygen Therapy: Patient Spontanous Breathing and Patient connected to nasal cannula oxygen ? ?Post-op Assessment: Report given to RN and Post -op Vital signs reviewed and stable ? ?Post vital signs: Reviewed and stable ? ?Last Vitals:  ?Vitals Value Taken Time  ?BP 143/97 11/14/21 1003  ?Temp 97.9   ?Pulse 63 11/14/21 1004  ?Resp 16 11/14/21 1004  ?SpO2 100 % 11/14/21 1004  ?Vitals shown include unvalidated device data. ? ?Last Pain:  ?Vitals:  ? 11/14/21 0756  ?TempSrc: Oral  ?PainSc: 0-No pain  ?   ? ?Patients Stated Pain Goal: 6 (11/14/21 0756) ? ?Complications: No notable events documented. ?

## 2021-11-15 ENCOUNTER — Encounter (HOSPITAL_COMMUNITY): Payer: Self-pay | Admitting: General Surgery

## 2021-11-18 ENCOUNTER — Telehealth: Payer: Self-pay | Admitting: Family Medicine

## 2021-11-18 NOTE — Telephone Encounter (Signed)
FMLA paperwork completed and faxed to Teresa (HR) at [phone]. Confirmation received. ? ?STD paperwork completed and faxed to Veatrice Kells at [employer] at [phone] and Teresa(HR).  Confirmation received.  ? ? ?Patient out of work 11/14/21 - 01/02/22 for umbilical hernia ?

## 2021-11-24 ENCOUNTER — Encounter: Payer: Self-pay | Admitting: General Surgery

## 2021-11-24 ENCOUNTER — Other Ambulatory Visit: Payer: Self-pay

## 2021-11-24 ENCOUNTER — Encounter: Payer: Self-pay | Admitting: *Deleted

## 2021-11-24 ENCOUNTER — Ambulatory Visit (INDEPENDENT_AMBULATORY_CARE_PROVIDER_SITE_OTHER): Payer: Commercial Managed Care - PPO | Admitting: General Surgery

## 2021-11-24 VITALS — BP 126/85 | HR 58 | Temp 97.4°F | Resp 12 | Ht 67.0 in | Wt 184.0 lb

## 2021-11-24 DIAGNOSIS — Z09 Encounter for follow-up examination after completed treatment for conditions other than malignant neoplasm: Secondary | ICD-10-CM

## 2021-11-24 NOTE — Progress Notes (Signed)
Subjective:  ?  ? Douglas Mcintyre  ?Here for postoperative visit, status post umbilical herniorrhaphy with mesh.  Patient states he has mild incisional pain.  It is only made worse with coughing or straining.  He denies any fever or chills.  He is avoiding lifting anything heavy. ?Objective:  ? ? BP 126/85   Pulse (!) 58   Temp (!) 97.4 ?F (36.3 ?C) (Oral)   Resp 12   Ht 5\' 7"  (1.702 m)   Wt 184 lb (83.5 kg)   SpO2 97%   BMI 28.82 kg/m?  ? ?General:  alert, cooperative, and no distress  ?Abdomen soft, incision healing well. ?   ? ?Assessment:  ? ? Doing well postoperatively.  ?  ?Plan:  ? ?May gradually increase his activity.  Gradually increase the amount he is lifting.  May return to work without restrictions on 12/14/2021.  Follow-up here as needed. ?

## 2023-04-03 IMAGING — CT CT ABD-PELV W/ CM
2 of 5 series · 16 of 46 positions shown, 18 images · IV contrast (Omnipaque or Isovue)
Comparison: None.

CLINICAL DATA: 50-year-old male with abdominal pain at known
umbilical hernia.

EXAM:
CT ABDOMEN AND PELVIS WITH CONTRAST
TECHNIQUE: Multidetector CT imaging of the abdomen and pelvis was performed
using the standard protocol following bolus administration of
intravenous contrast.

[Series 2: axial st · axial · 0.93mm/px · z∈[+774,+1254]mm · 13 of 108 slices shown, 15 images]
[im 6/108  soft-tissue]
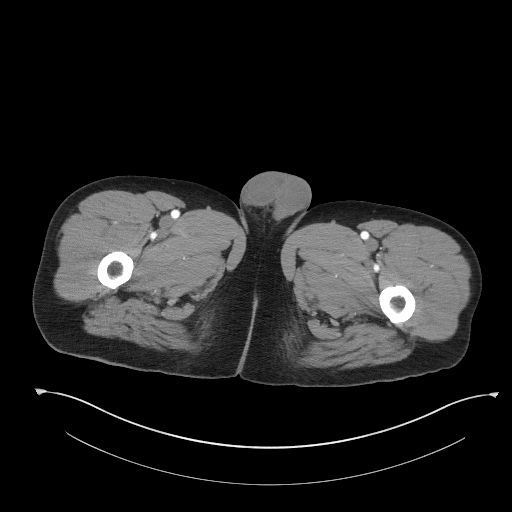
[im 6/108  bone]
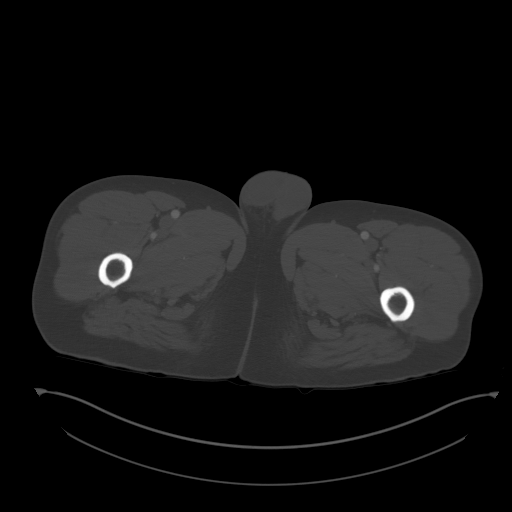
[im 17/108  soft-tissue]
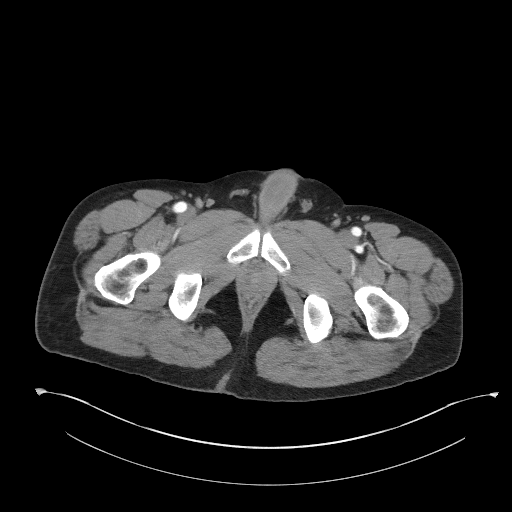
[im 23/108  soft-tissue]
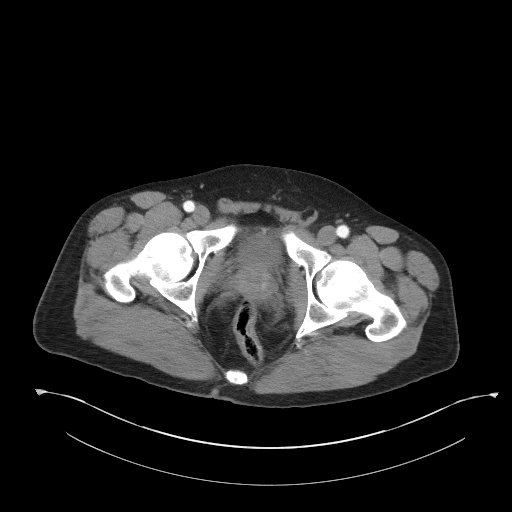
[im 29/108  soft-tissue]
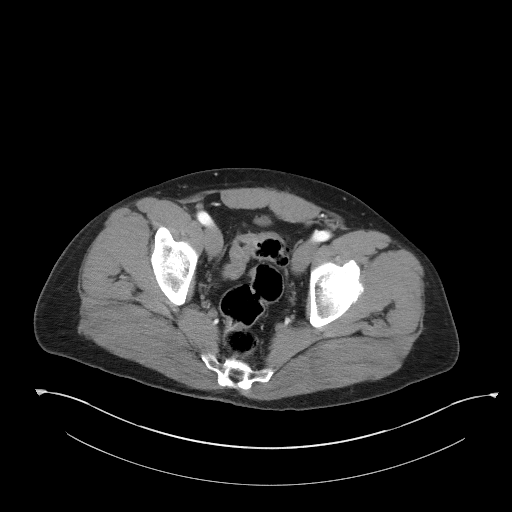
[im 40/108  soft-tissue]
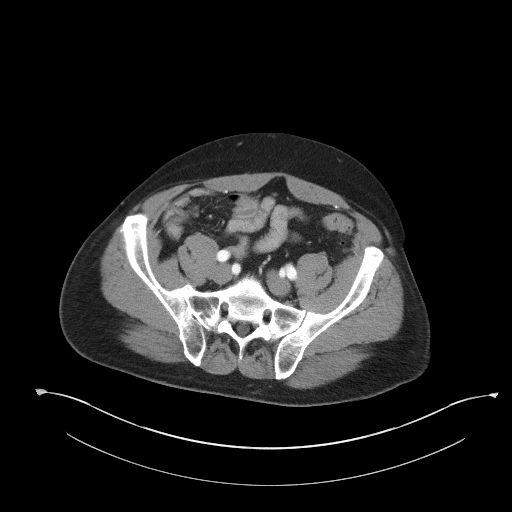
[im 46/108  soft-tissue]
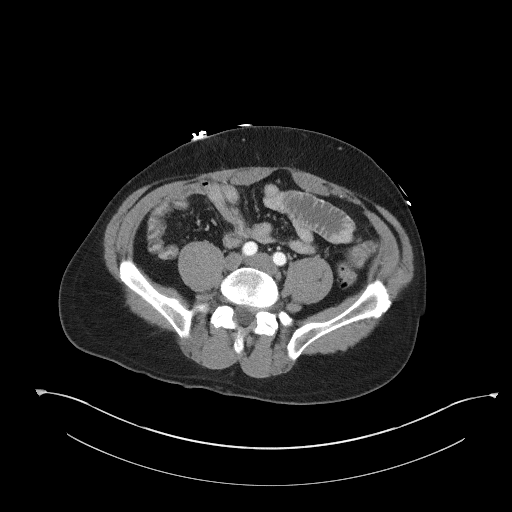
[im 57/108  soft-tissue]
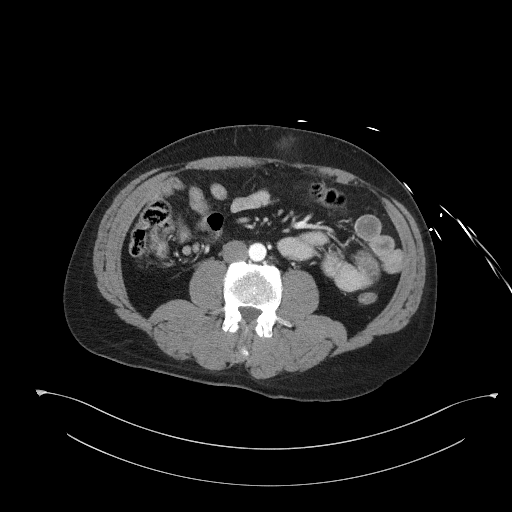
[im 62/108  soft-tissue]
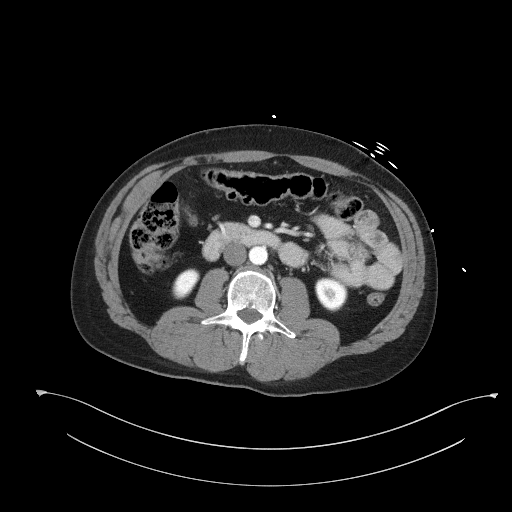
[im 68/108  soft-tissue]
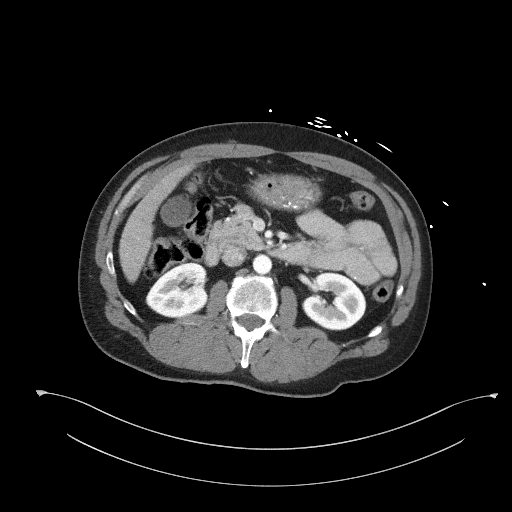
[im 68/108  bone]
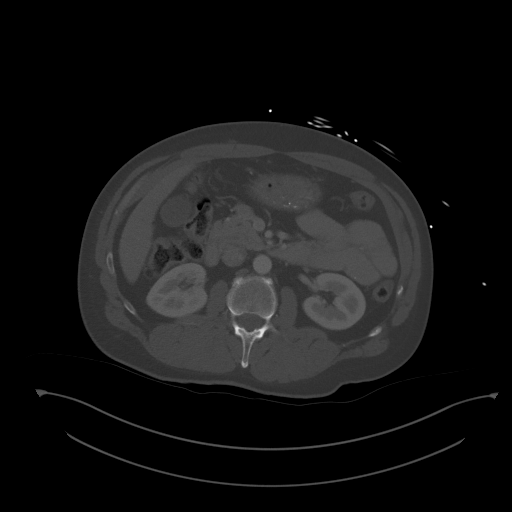
[im 79/108  soft-tissue]
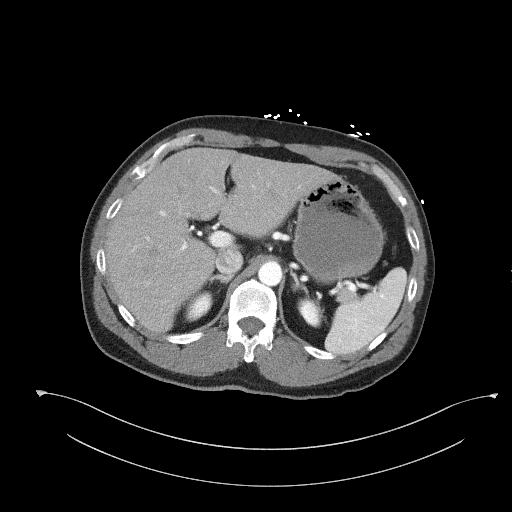
[im 85/108  soft-tissue]
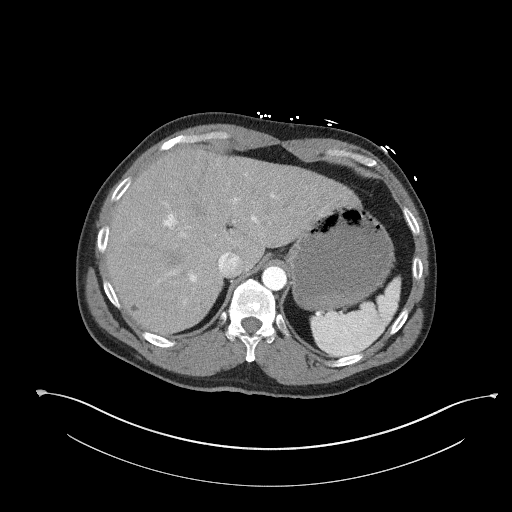
[im 91/108  soft-tissue]
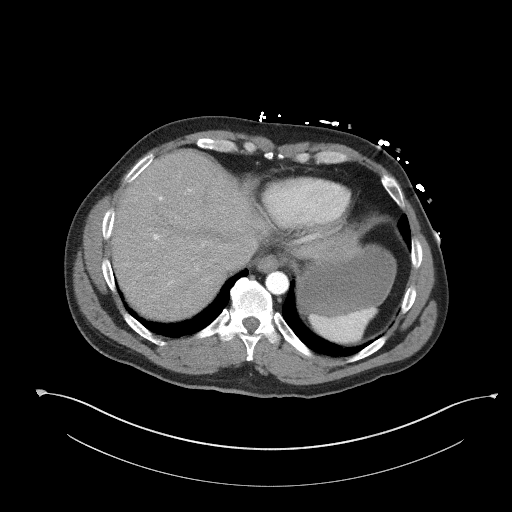
[im 102/108  soft-tissue]
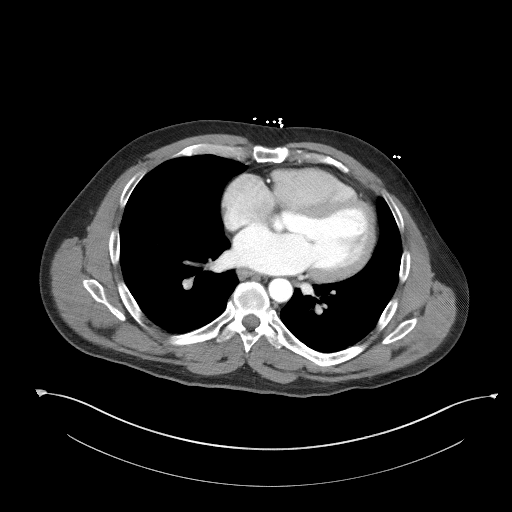

[Series 5: coronal st · coronal · 0.91mm/px · 3 of 97 slices shown]
[im 33/97  soft-tissue]
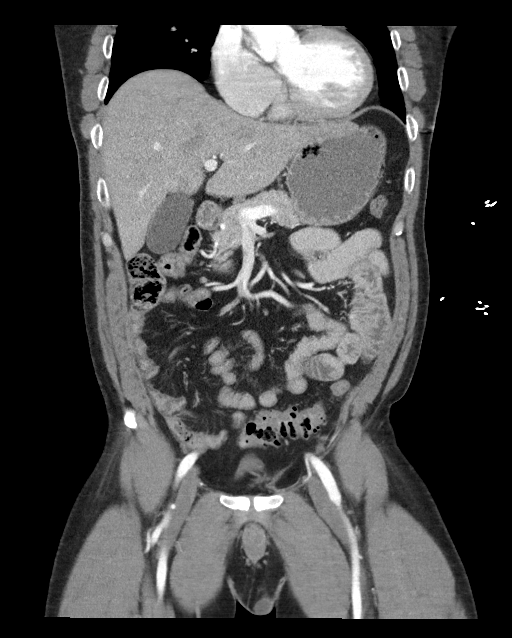
[im 43/97  soft-tissue]
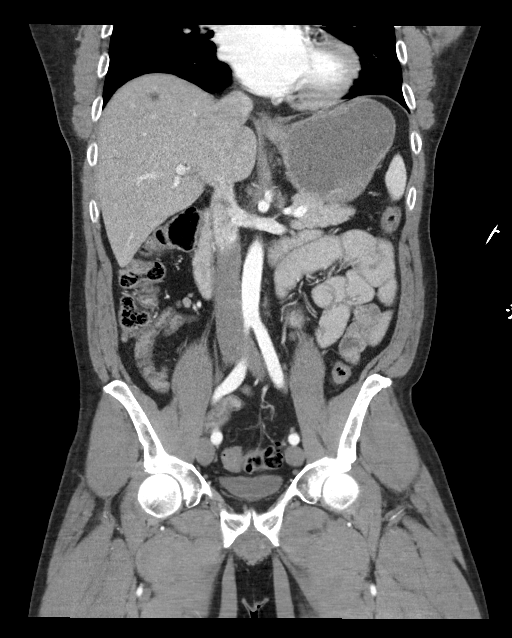
[im 54/97  soft-tissue]
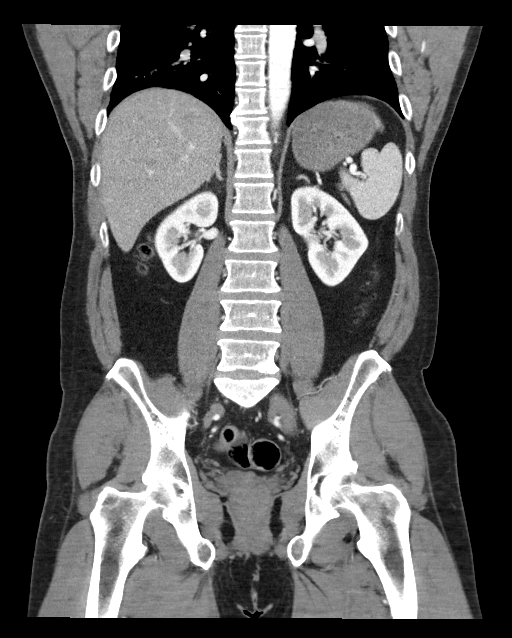

[16 of 46 positions shown; findings below may reference images not displayed]

RADIATION DOSE REDUCTION: This exam was performed according to the
departmental dose-optimization program which includes automated
exposure control, adjustment of the mA and/or kV according to
patient size and/or use of iterative reconstruction technique.

CONTRAST:  100mL OMNIPAQUE IOHEXOL 300 MG/ML  SOLN
FINDINGS: Lower chest: No acute abnormality.

Hepatobiliary: The liver and gallbladder are unremarkable except for
small hepatic cysts. No biliary dilatation.

Pancreas: Unremarkable

Spleen: Unremarkable

Adrenals/Urinary Tract: The kidneys, adrenal glands and bladder are
unremarkable.

Stomach/Bowel: A small to moderate umbilical hernia is identified
containing a loop of small bowel, with mildly distended loops small
bowel loops proximal to this hernia - compatible with
early/developing small bowel obstruction. There is no evidence of
pneumoperitoneum or abscess.

Colonic diverticulosis is identified without evidence of acute
diverticulitis. The appendix is normal.

Vascular/Lymphatic: No significant vascular findings are present. No
enlarged abdominal or pelvic lymph nodes.

Reproductive: Prostate appears unremarkable. The seminal vesicles
are prominent without adjacent inflammation or defined mass.

Other: No ascites, focal collection or pneumoperitoneum.

Musculoskeletal: No acute or suspicious bony abnormalities are
noted.
IMPRESSION: 1. Small to moderate umbilical hernia containing a loop of small
bowel and causing early/developing small bowel obstruction. No
evidence of pneumoperitoneum or abscess.
